# Patient Record
Sex: Male | Born: 1955 | Race: Black or African American | Hispanic: No | Marital: Single | Smoking: Never smoker
Health system: Southern US, Community
[De-identification: ages and names within clinical notes are randomized; demographics above are authoritative.]

## PROBLEM LIST (undated history)

## (undated) DIAGNOSIS — I2510 Atherosclerotic heart disease of native coronary artery without angina pectoris: Secondary | ICD-10-CM

## (undated) DIAGNOSIS — C801 Malignant (primary) neoplasm, unspecified: Secondary | ICD-10-CM

## (undated) DIAGNOSIS — I10 Essential (primary) hypertension: Secondary | ICD-10-CM

## (undated) HISTORY — PX: PROSTATE SURGERY: SHX751

## (undated) HISTORY — PX: CORONARY ANGIOPLASTY WITH STENT PLACEMENT: SHX49

---

## 2017-06-23 ENCOUNTER — Emergency Department (HOSPITAL_BASED_OUTPATIENT_CLINIC_OR_DEPARTMENT_OTHER)
Admission: EM | Admit: 2017-06-23 | Discharge: 2017-06-23 | Disposition: A | Discharge status: Home / Self Care | Payer: No Typology Code available for payment source | Attending: Emergency Medicine | Admitting: Emergency Medicine

## 2017-06-23 ENCOUNTER — Other Ambulatory Visit: Payer: Self-pay

## 2017-06-23 ENCOUNTER — Encounter (HOSPITAL_BASED_OUTPATIENT_CLINIC_OR_DEPARTMENT_OTHER): Payer: Self-pay | Admitting: *Deleted

## 2017-06-23 ENCOUNTER — Emergency Department (HOSPITAL_BASED_OUTPATIENT_CLINIC_OR_DEPARTMENT_OTHER): Payer: No Typology Code available for payment source

## 2017-06-23 DIAGNOSIS — Z955 Presence of coronary angioplasty implant and graft: Secondary | ICD-10-CM | POA: Diagnosis not present

## 2017-06-23 DIAGNOSIS — I10 Essential (primary) hypertension: Secondary | ICD-10-CM | POA: Insufficient documentation

## 2017-06-23 DIAGNOSIS — Z7982 Long term (current) use of aspirin: Secondary | ICD-10-CM | POA: Insufficient documentation

## 2017-06-23 DIAGNOSIS — I2510 Atherosclerotic heart disease of native coronary artery without angina pectoris: Secondary | ICD-10-CM | POA: Diagnosis not present

## 2017-06-23 DIAGNOSIS — Y929 Unspecified place or not applicable: Secondary | ICD-10-CM | POA: Diagnosis not present

## 2017-06-23 DIAGNOSIS — Y939 Activity, unspecified: Secondary | ICD-10-CM | POA: Insufficient documentation

## 2017-06-23 DIAGNOSIS — W231XXA Caught, crushed, jammed, or pinched between stationary objects, initial encounter: Secondary | ICD-10-CM | POA: Diagnosis not present

## 2017-06-23 DIAGNOSIS — Y999 Unspecified external cause status: Secondary | ICD-10-CM | POA: Diagnosis not present

## 2017-06-23 DIAGNOSIS — S62666A Nondisplaced fracture of distal phalanx of right little finger, initial encounter for closed fracture: Secondary | ICD-10-CM | POA: Insufficient documentation

## 2017-06-23 DIAGNOSIS — S60944A Unspecified superficial injury of right ring finger, initial encounter: Secondary | ICD-10-CM | POA: Diagnosis present

## 2017-06-23 DIAGNOSIS — Z79899 Other long term (current) drug therapy: Secondary | ICD-10-CM | POA: Diagnosis not present

## 2017-06-23 DIAGNOSIS — S62639A Displaced fracture of distal phalanx of unspecified finger, initial encounter for closed fracture: Secondary | ICD-10-CM

## 2017-06-23 DIAGNOSIS — Z859 Personal history of malignant neoplasm, unspecified: Secondary | ICD-10-CM | POA: Diagnosis not present

## 2017-06-23 HISTORY — DX: Atherosclerotic heart disease of native coronary artery without angina pectoris: I25.10

## 2017-06-23 HISTORY — DX: Essential (primary) hypertension: I10

## 2017-06-23 HISTORY — DX: Malignant (primary) neoplasm, unspecified: C80.1

## 2017-06-23 NOTE — ED Provider Notes (Signed)
[facility] EMERGENCY DEPARTMENT Provider Note   CSN: [id number] Arrival date & time: 06/23/17  1431     History   Chief Complaint Chief Complaint  Patient presents with  . Finger Injury    HPI Tegh Franek is a 62 y.o. male.  The history is provided by the patient and medical records. No language interpreter was used.   Jcion Buddenhagen is a 62 y.o. male who presents to ED for persistent, constant right ring finger pain x 3 days. Associated with swelling. Patient states a piece of furniture fell on the finger. He has been applying ice with little improvement. No medications taken. Thought it would get better on its own, but pain persisted, therefore sought care. No numbness, tingling or weakness.   Past Medical History:  Diagnosis Date  . Cancer ([place])   . Coronary artery disease   . Hypertension     There are no active problems to display for this patient.   Past Surgical History:  Procedure Laterality Date  . CORONARY ANGIOPLASTY WITH STENT PLACEMENT    . PROSTATE SURGERY         Home Medications    Prior to Admission medications   Medication Sig Start Date End Date Taking? Authorizing Provider  aspirin EC 81 MG tablet Take 81 mg by mouth daily.   Yes [provider]  Atorvastatin Calcium (LIPITOR PO) Take by mouth.   Yes [provider]  Cholecalciferol (VITAMIN D PO) Take by mouth.   Yes [provider]  lisinopril-hydrochlorothiazide (PRINZIDE,ZESTORETIC) 10-12.5 MG tablet Take 1 tablet by mouth daily.   Yes [provider]    Family History No family history on file.  Social History Social History   Tobacco Use  . Smoking status: Never Smoker  . Smokeless tobacco: Never Used  Substance Use Topics  . Alcohol use: Yes    Frequency: Never  . Drug use: No     Allergies   Penicillins   Review of Systems Review of Systems  Musculoskeletal: Positive for arthralgias.  Neurological: Negative for weakness and  numbness.     Physical Exam Updated Vital Signs BP 122/88 (BP Location: Right Arm)   Pulse 68   Temp 98 F (36.7 C) (Oral)   Resp 18   Ht 6' (1.829 m)   Wt 90.3 kg (199 lb)   SpO2 100%   BMI 26.99 kg/m   Physical Exam  Constitutional: He appears well-developed and well-nourished. No distress.  HENT:  Head: Normocephalic and atraumatic.  Neck: Neck supple.  Cardiovascular: Normal rate, regular rhythm and normal heart sounds.  No murmur heard. Pulmonary/Chest: Effort normal and breath sounds normal. No respiratory distress. He has no wheezes. He has no rales.  Musculoskeletal: Normal range of motion.  Tenderness to right ringer finger. No open wounds. 5.5 strength to the right hand. Sensation intact. 2+ radial pulse.   Neurological: He is alert.  Skin: Skin is warm and dry.  Nursing note and vitals reviewed.    ED Treatments / Results  Labs (all labs ordered are listed, but only abnormal results are displayed) Labs Reviewed - No data to display  EKG  EKG Interpretation None       Radiology Dg Finger Ring Right  Result Date: 06/23/2017 CLINICAL DATA:  Crush injury of the distal phalanx of the ring finger 3 days ago. No previous injury. EXAM: RIGHT RING FINGER 2+V COMPARISON:  None in PACs FINDINGS: The patient has sustained a comminuted nondisplaced fracture through  the tuft of the distal phalanx of the right fourth finger. More proximally the distal phalanx is unremarkable. The proximal and middle phalanges are normal. The joint spaces are well maintained. IMPRESSION: Nondisplaced tuft fracture of the distal phalanx of the right ring finger. Electronically Signed   By: David  Martinique M.D.   On: 06/23/2017 14:56    Procedures Procedures (including critical care time)  Medications Ordered in ED Medications - No data to display   Initial Impression / Assessment and Plan / ED Course  I have reviewed the triage vital signs and the nursing notes.  Pertinent labs &  imaging results that were available during my care of the patient were reviewed by me and considered in my medical decision making (see chart for details).    Burtis Imhoff is a 62 y.o. male who presents to ED for right finger pain after object fell onto the digit 3 days ago. NVI on exam. No signs of infection. No open wounds. X-ray shows nondisplaced tuft fx of the distal phalanx. Will place in finger splint. Symptomatic home care instructions discussed. Follow up with PCP. All questions answered.    Final Clinical Impressions(s) / ED Diagnoses   Final diagnoses:  Closed fracture of tuft of distal phalanx of finger    ED Discharge Orders    None       Ward, Ozella Almond, PA-C 06/23/17 1641    Little, Wenda Overland, MD 06/24/17 [phone]

## 2017-06-23 NOTE — Discharge Instructions (Signed)
It was my pleasure taking care of you today!   Tylenol or ibuprofen as needed for pain. Ice for pain relief as well.   Follow up with your primary care doctor.   Return to ER for new or worsening symptoms, any additional concerns.

## 2017-06-23 NOTE — ED Triage Notes (Signed)
An object fell on his right ring finger 3 days ago. Blood noted under his nail. Swelling and guarding.

## 2018-06-27 IMAGING — CR DG FINGER RING 2+V*R*
3 series · 3 of 3 positions shown · non-contrast
Comparison: None in PACs

CLINICAL DATA: Crush injury of the distal phalanx of the ring
finger 3 days ago. No previous injury.

EXAM:
RIGHT RING FINGER 2+V

[x finger pa right]
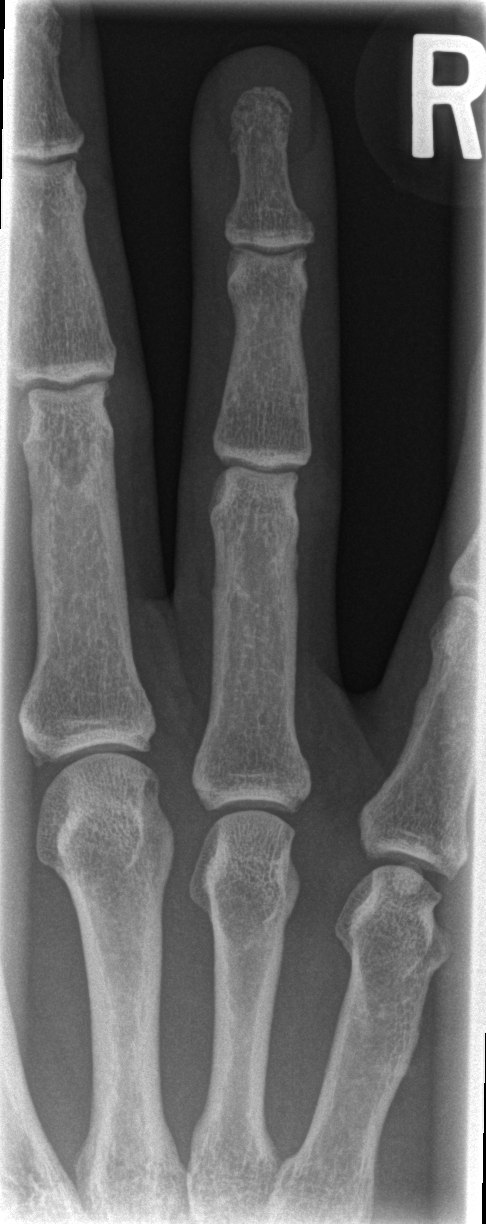

[x finger obl. right]
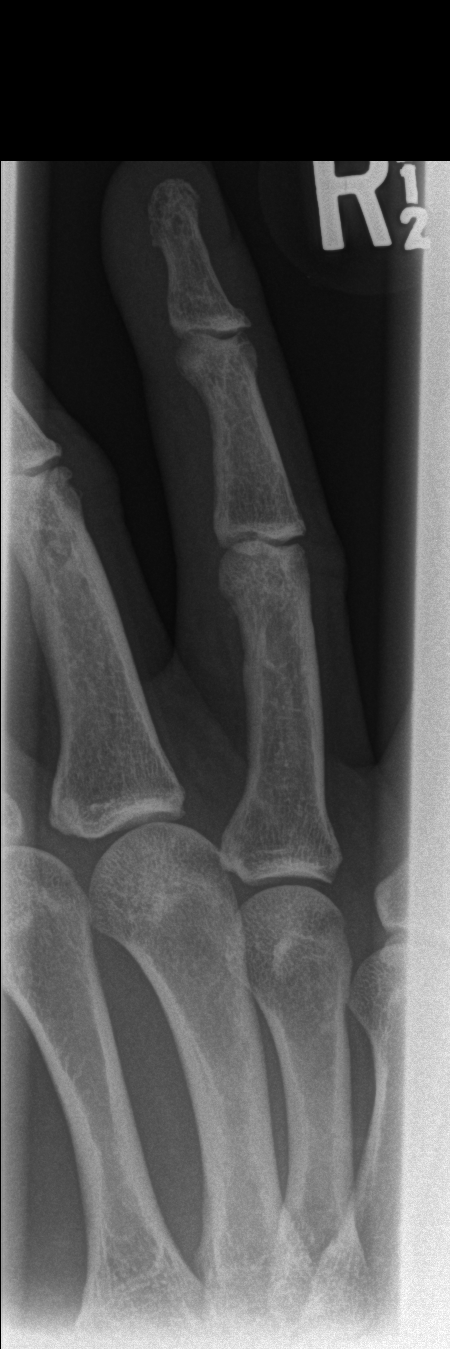

[x finger lateral right]
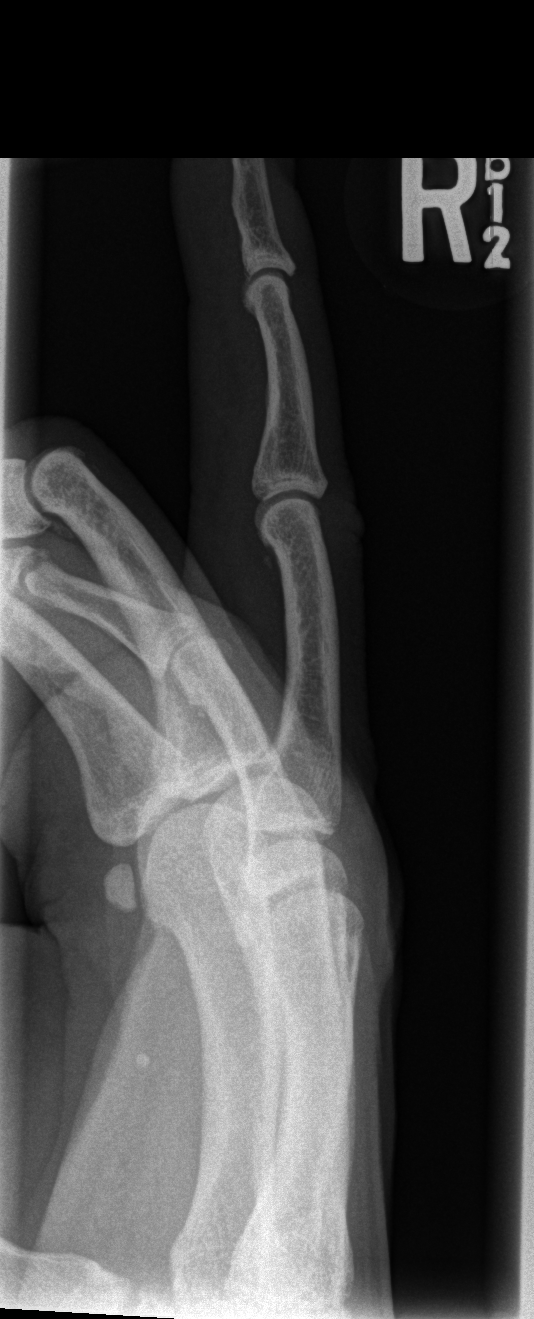

[3 of 3 positions shown; findings below may reference images not displayed]

FINDINGS: The patient has sustained a comminuted nondisplaced fracture through
the tuft of the distal phalanx of the right fourth finger. More
proximally the distal phalanx is unremarkable. The proximal and
middle phalanges are normal. The joint spaces are well maintained.
IMPRESSION: Nondisplaced tuft fracture of the distal phalanx of the right ring
finger.
